# Patient Record
Sex: Female | Born: 1975 | Race: White | Hispanic: Yes | Marital: Single | Smoking: Never smoker
Health system: Southern US, Community
[De-identification: ages and names within clinical notes are randomized; demographics above are authoritative.]

---

## 2003-05-29 ENCOUNTER — Other Ambulatory Visit: Admission: RE | Admit: 2003-05-29 | Discharge: 2003-05-29 | Payer: Self-pay | Admitting: Obstetrics and Gynecology

## 2004-04-02 ENCOUNTER — Ambulatory Visit: Payer: Self-pay | Admitting: Internal Medicine

## 2004-05-08 ENCOUNTER — Ambulatory Visit: Payer: Self-pay | Admitting: *Deleted

## 2004-05-08 ENCOUNTER — Ambulatory Visit: Payer: Self-pay | Admitting: Internal Medicine

## 2004-05-11 ENCOUNTER — Emergency Department (HOSPITAL_COMMUNITY): Admission: EM | Admit: 2004-05-11 | Discharge: 2004-05-11 | Payer: Self-pay | Admitting: Emergency Medicine

## 2004-05-22 ENCOUNTER — Ambulatory Visit: Payer: Self-pay | Admitting: Internal Medicine

## 2004-06-04 ENCOUNTER — Ambulatory Visit: Payer: Self-pay | Admitting: Obstetrics and Gynecology

## 2004-07-01 ENCOUNTER — Ambulatory Visit (HOSPITAL_COMMUNITY): Admission: RE | Admit: 2004-07-01 | Discharge: 2004-07-01 | Payer: Self-pay | Admitting: Obstetrics and Gynecology

## 2004-08-08 ENCOUNTER — Ambulatory Visit: Payer: Self-pay | Admitting: Obstetrics and Gynecology

## 2007-04-07 IMAGING — US US TRANSVAGINAL NON-OB
1 series · 14 of 25 positions shown · non-contrast
Comparison: None.

CLINICAL DATA: Abdominal and pelvic pain.  History of appendectomy and umbilical hernia repair.   Negative pregnancy test.  Afebrile.  
 PELVIC ULTRASOUND COMPLETE (TRANSABDOMINAL AND ENDOVAGINAL STUDIES):

[Series 1: unknown · 0.32mm/px · 14 of 79 slices shown]
[im 1/79]
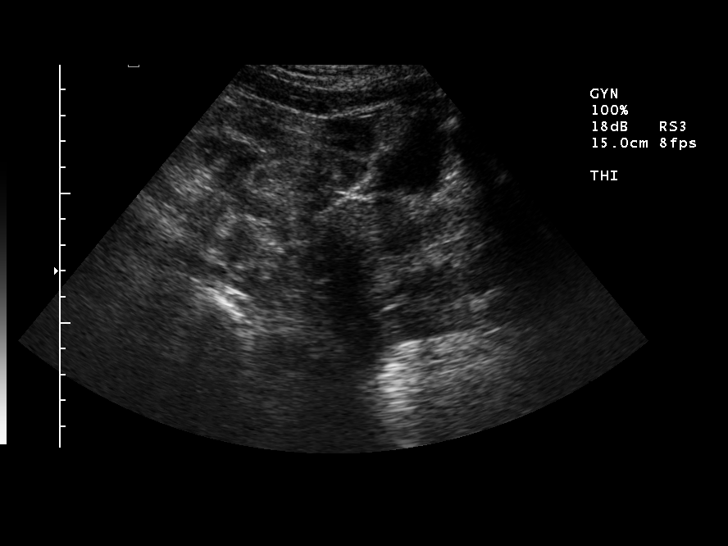
[im 7/79]
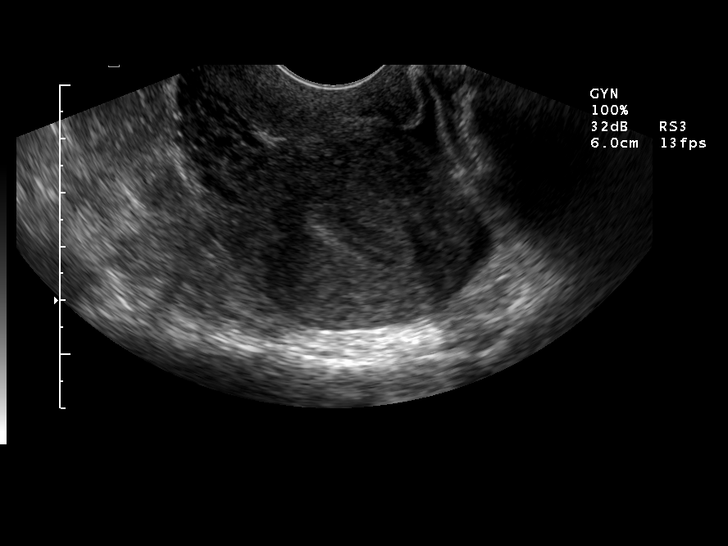
[im 14/79]
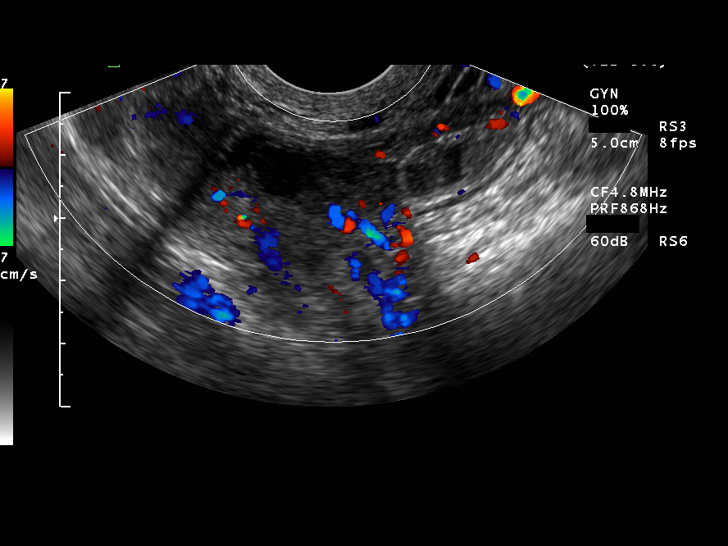
[im 20/79]
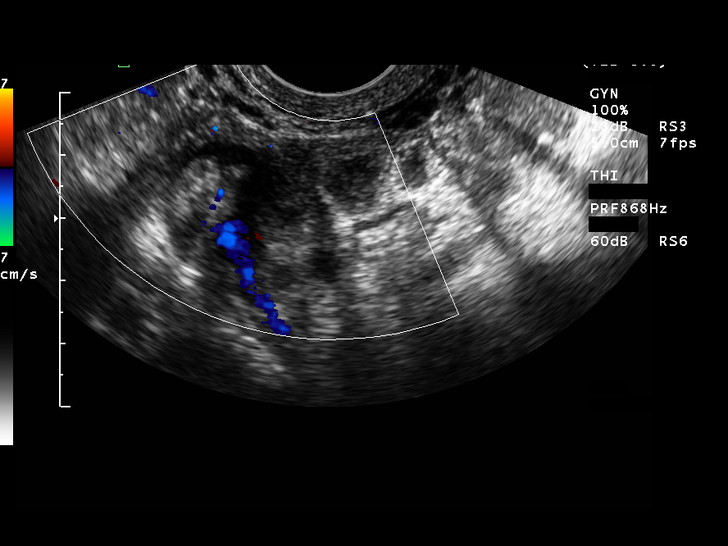
[im 27/79]
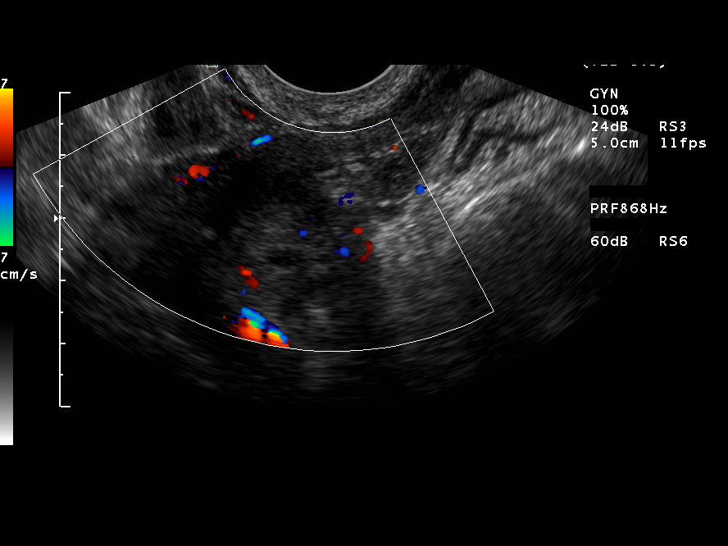
[im 30/79]
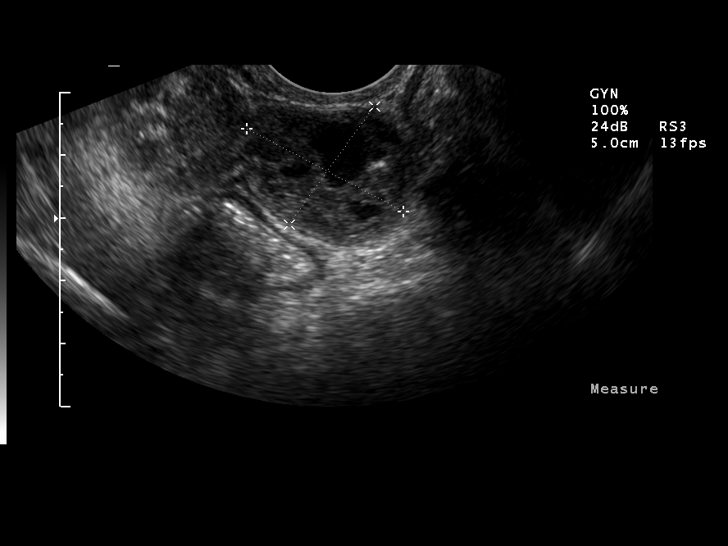
[im 36/79]
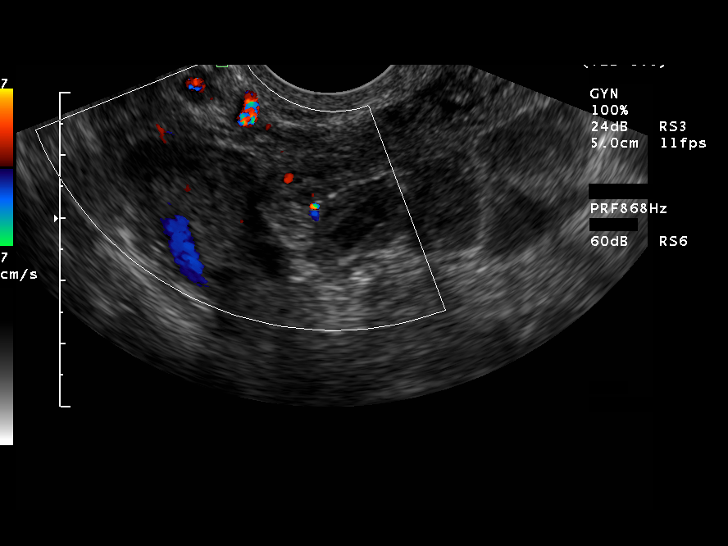
[im 43/79]
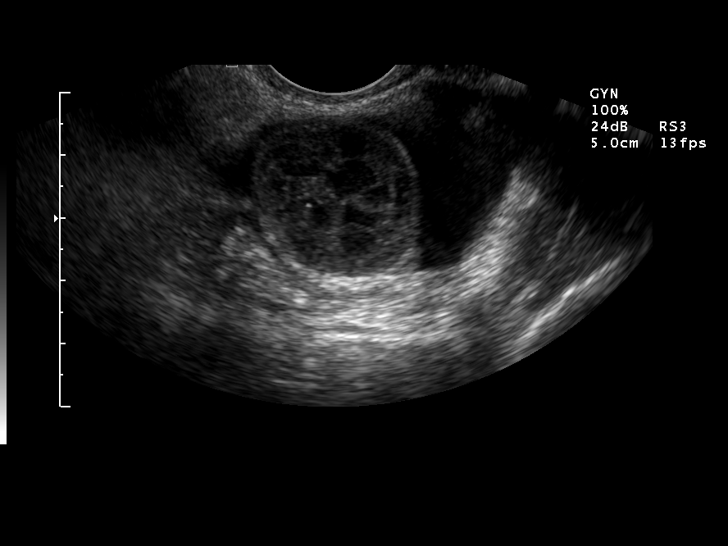
[im 49/79]
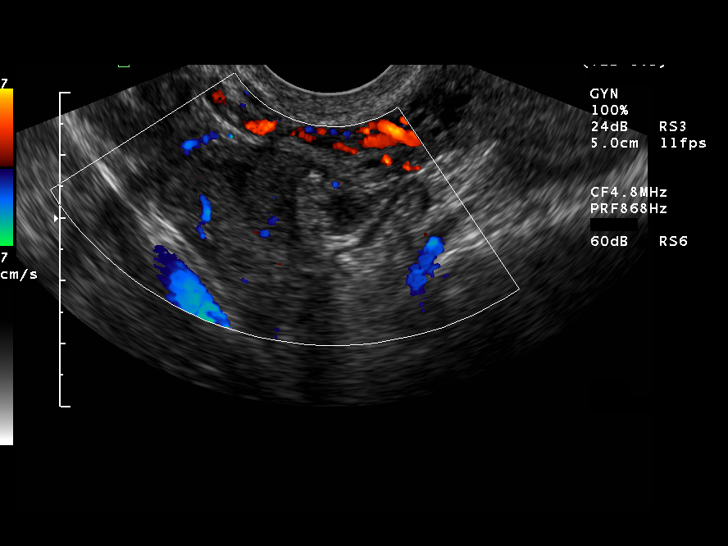
[im 53/79]
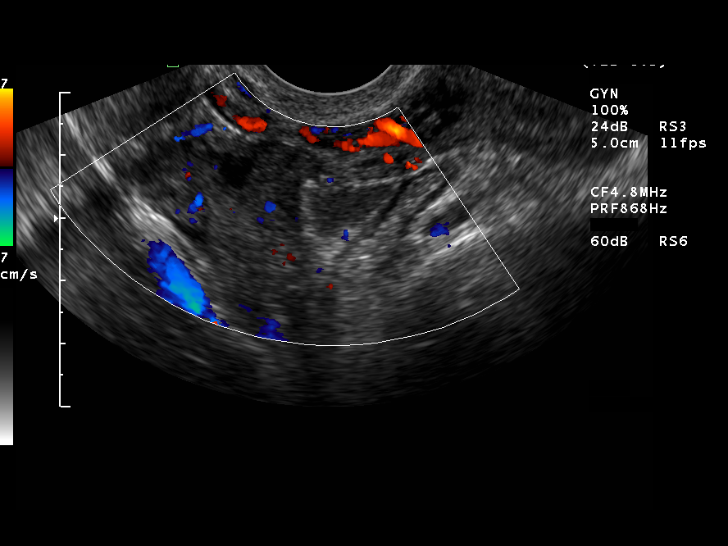
[im 59/79]
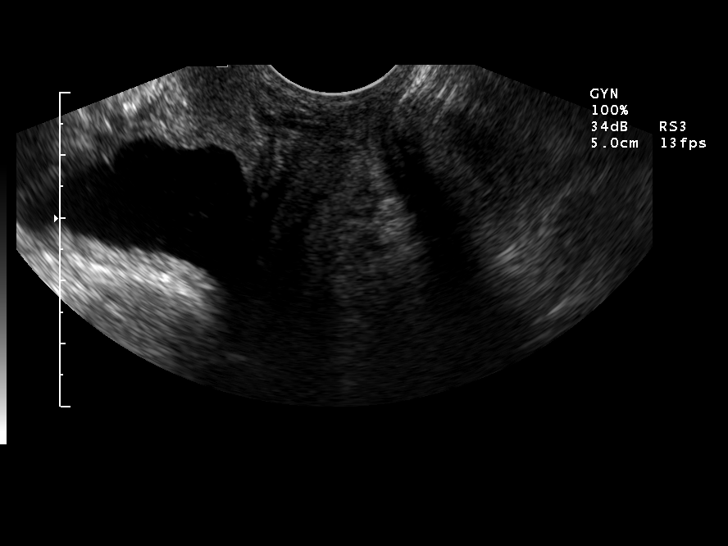
[im 66/79]
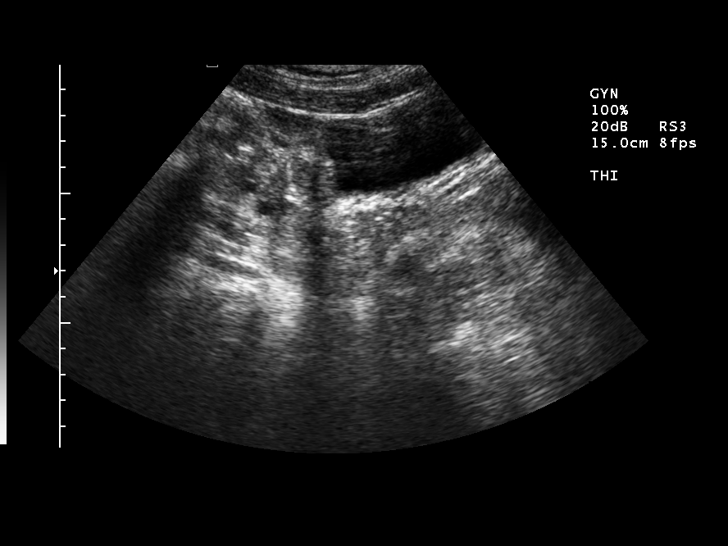
[im 72/79]
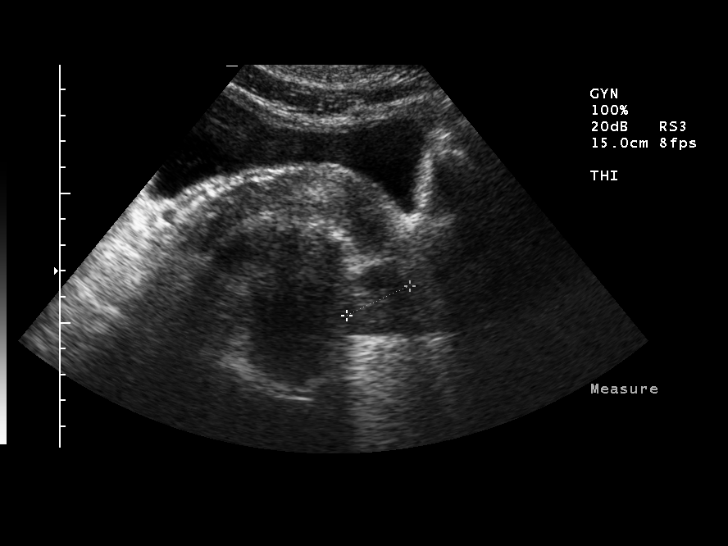
[im 79/79]
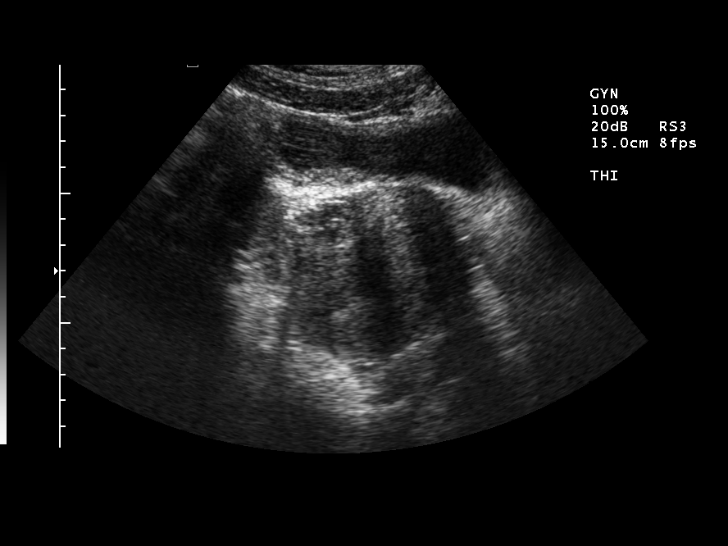

[14 of 25 positions shown; findings below may reference images not displayed]

FINDINGS: The uterus is retroverted, measuring approximately 6.2 X 4.2 X 4.6 cm.  Endometrium measures 1.3 cm in thickness.  No myometrial abnormalities are demonstrated.  
 There is a moderate amount of free pelvic fluid, which is mildly echogenic.  The right ovary measures 3.0 X 1.9 X 2.1 cm and contains a probable collapsing cyst measuring 1.4 cm in greatest dimension.  The left ovary appears to be displaced into the pelvic cul-de-sac and measures 2.8 X 2.3 X 3.1 cm.  It contains multiple small follicles without focally suspicious lesion.
IMPRESSION: 1.  Moderate amount of free pelvic fluid with collapsing right ovarian cyst, suggesting recently ruptured ovarian cyst. 
 2.  Uterus and left ovary demonstrate no suspicious findings.

## 2009-05-09 ENCOUNTER — Ambulatory Visit (HOSPITAL_COMMUNITY): Admission: RE | Admit: 2009-05-09 | Discharge: 2009-05-09 | Payer: Self-pay | Admitting: Obstetrics and Gynecology

## 2009-06-13 ENCOUNTER — Ambulatory Visit (HOSPITAL_COMMUNITY): Admission: RE | Admit: 2009-06-13 | Discharge: 2009-06-13 | Payer: Self-pay | Admitting: Obstetrics and Gynecology

## 2010-02-03 ENCOUNTER — Encounter: Payer: Self-pay | Admitting: *Deleted

## 2010-02-03 ENCOUNTER — Encounter: Payer: Self-pay | Admitting: Obstetrics and Gynecology

## 2010-05-31 NOTE — Group Therapy Note (Signed)
NAME:  Melissa Brady, Melissa Brady NO.:  [id number]   MEDICAL RECORD NO.:  [id number]          PATIENT TYPE:  WOC   LOCATION:  [facility]                   FACILITY:  [facility]   PHYSICIAN:  Argentina Donovan, MD        DATE OF BIRTH:  23-Jan-1975   DATE OF SERVICE:                                    CLINIC NOTE   HISTORY OF PRESENT ILLNESS:  The patient is a 35 year old gravida 1, para 1-  0-0-1 who is seen by [facility], and over the past 2 months, has had some  lower abdominal pain, and was seen there, and had an ultrasound that showed  a moderate amount of free fluid and a collapsing right ovarian cyst.  We are  going to need a followup on that ultrasound in 3 weeks which would be 6  weeks from the time of the previous one.  We have discussed ovarian  suppression with birth control pills.  She is not anxious to take medication  at this time.  In addition to this, she is complaining of epigastric pain.  She has a stressful job.  She has a history of umbilical hernia repair in  [place] and ascites, and we will do an H. pylori test, and treat if  necessary.  I will call the patient with the results of that test when it  does come back.   IMPRESSION:  1.  Epigastric pain.  2.  Pelvic pain of a month-and-a-half duration secondary to a ruptured      ovarian cyst.      PR/MEDQ  D:  06/04/2004  T:  06/04/2004  Job:  [id number]

## 2012-05-09 IMAGING — US US OB DETAIL+14 WK
1 series · 14 of 28 positions shown · non-contrast
Comparison: none

OBSTETRICAL ULTRASOUND:
 This ultrasound was performed in The [HOSPITAL], and the [facility] report will be stored to [REDACTED] PACS.  This report is also available in [HOSPITAL]?s accessANYware.

[Series 1: us ob detail+14 wk · 14 of 96 slices shown]
[im 4/96]
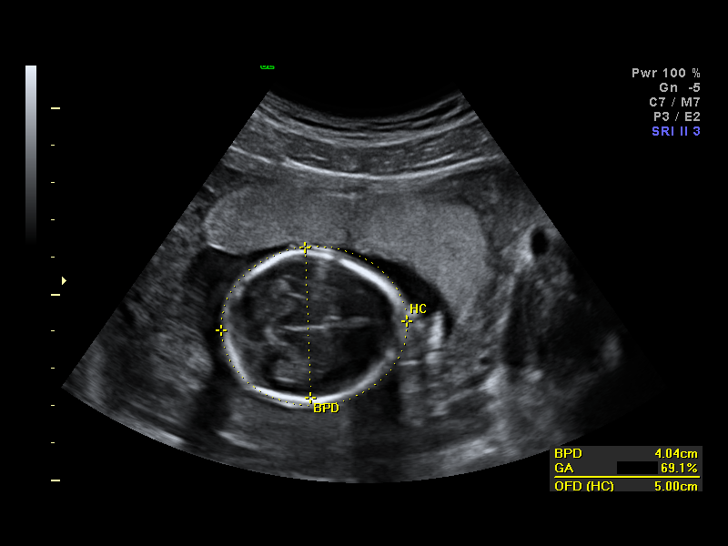
[im 11/96]
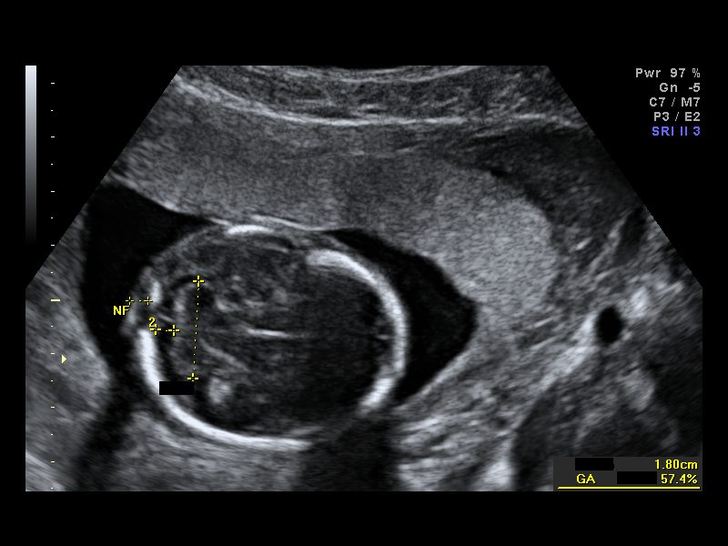
[im 18/96]
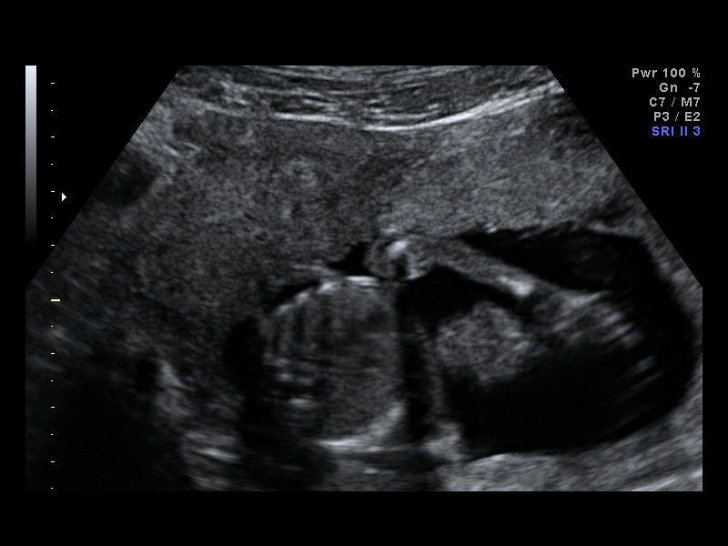
[im 25/96]
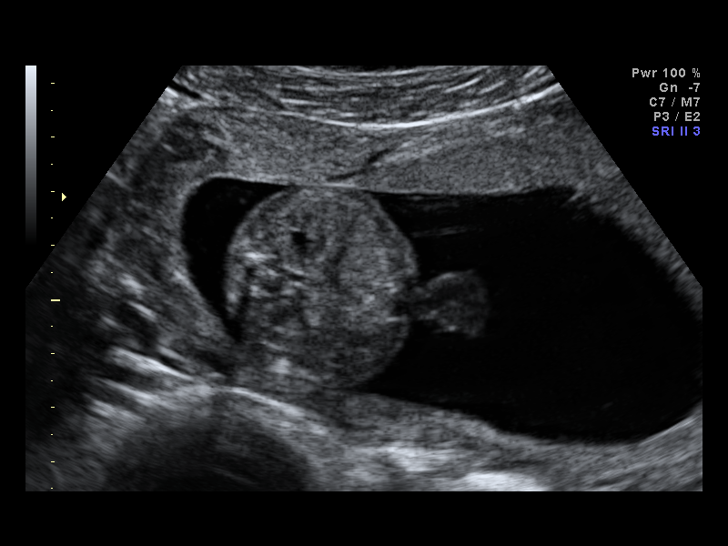
[im 32/96]
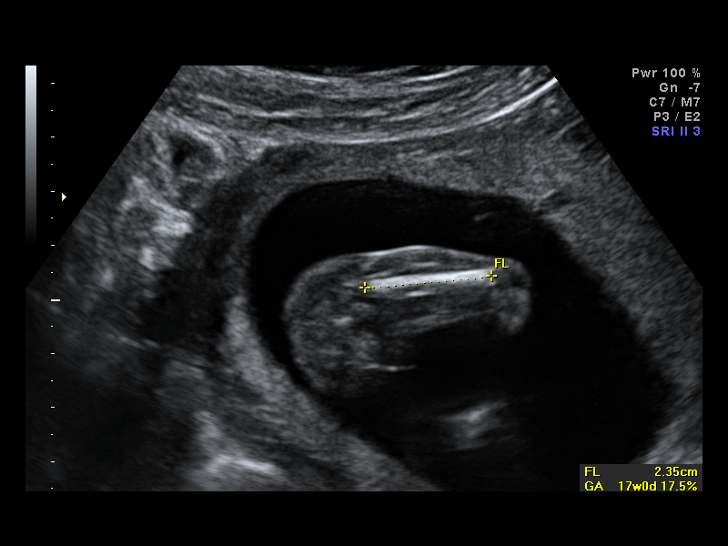
[im 39/96]
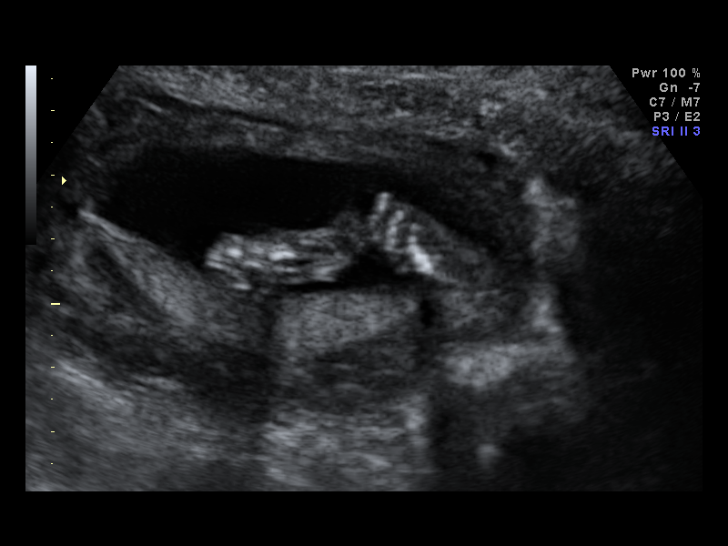
[im 46/96]
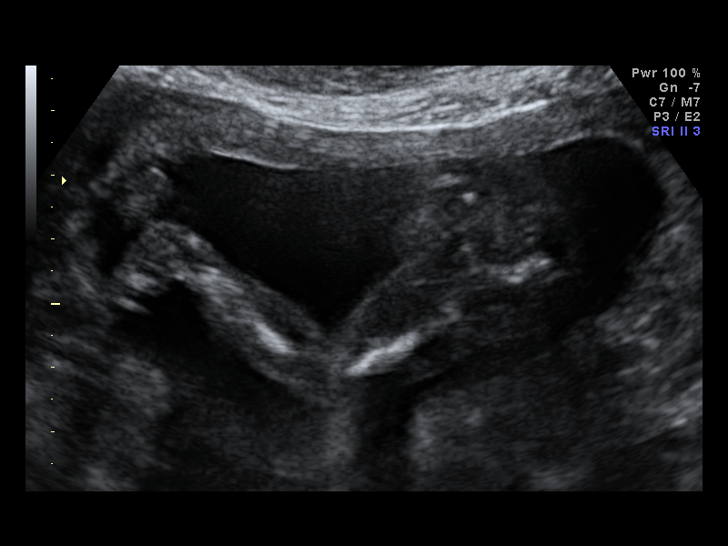
[im 53/96]
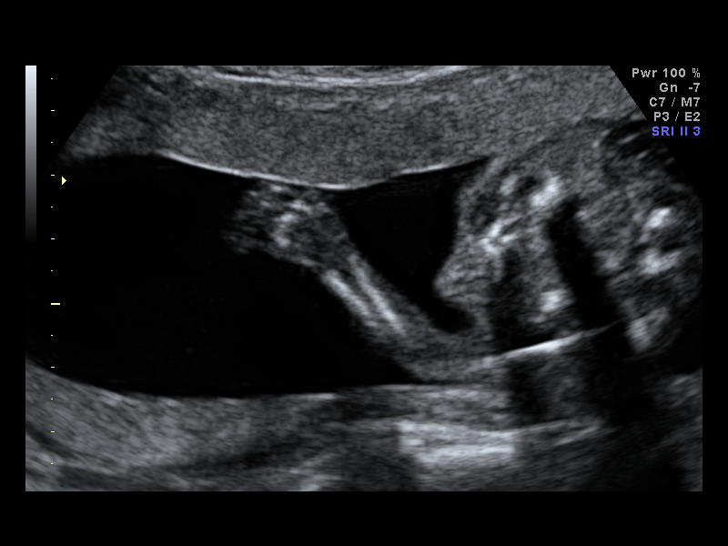
[im 60/96]
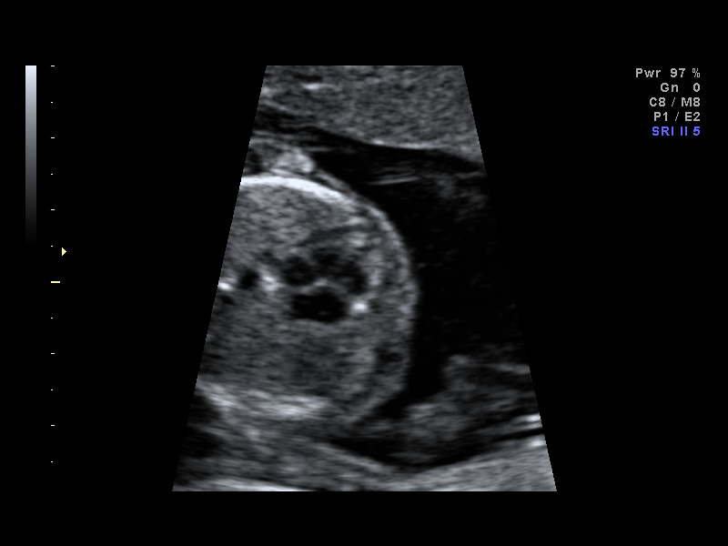
[im 67/96]
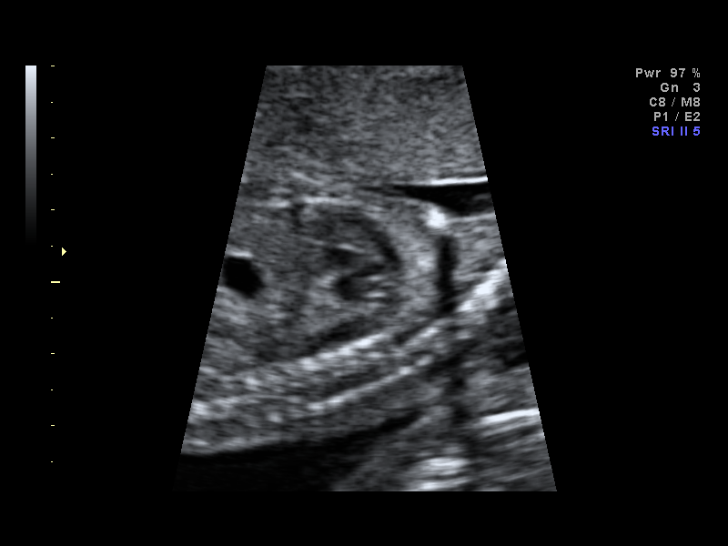
[im 74/96]
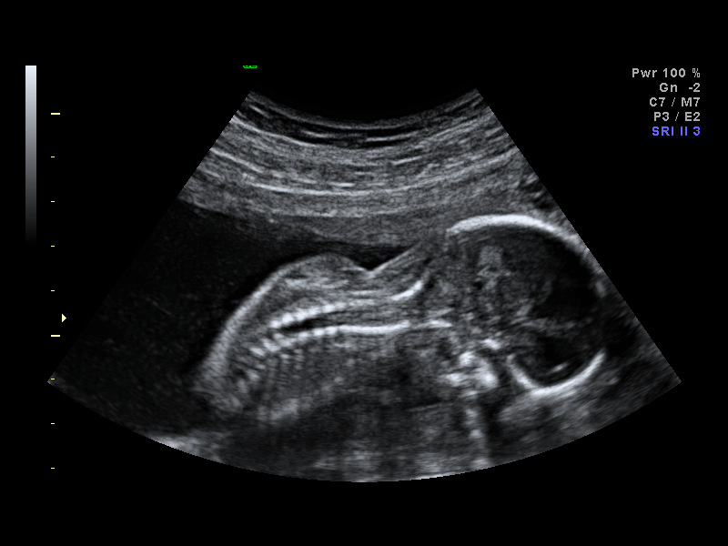
[im 81/96]
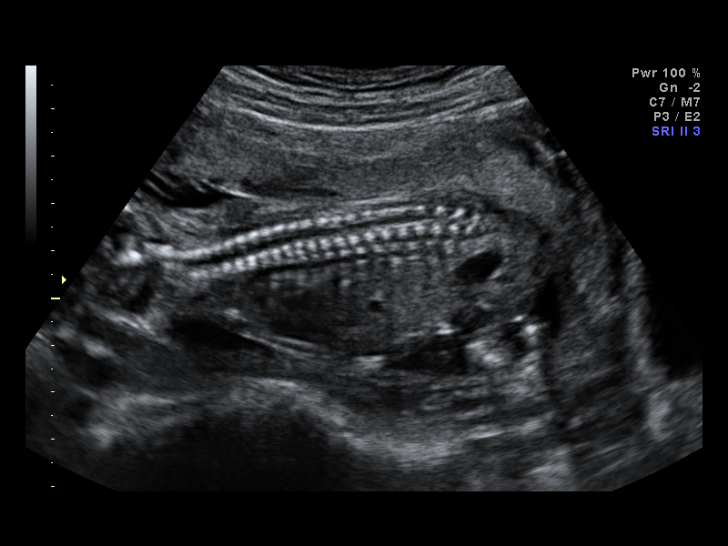
[im 88/96]
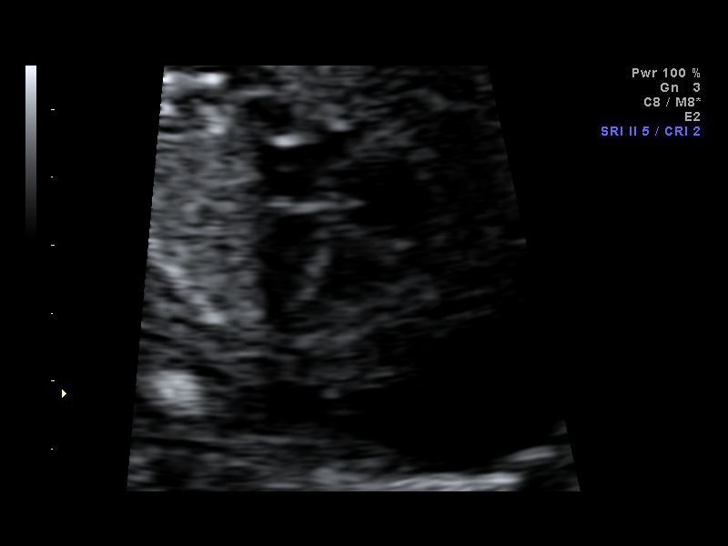
[im 96/96]
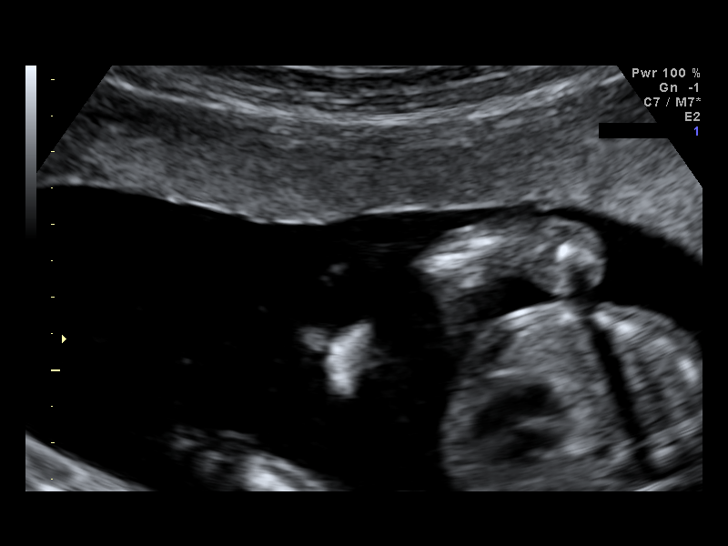

[14 of 28 positions shown; findings below may reference images not displayed]

IMPRESSION: [facility] has also been faxed to the ordering physician.

## 2015-09-03 ENCOUNTER — Encounter: Payer: Self-pay | Admitting: Obstetrics & Gynecology

## 2015-10-15 ENCOUNTER — Ambulatory Visit (INDEPENDENT_AMBULATORY_CARE_PROVIDER_SITE_OTHER): Payer: No Typology Code available for payment source | Admitting: Obstetrics & Gynecology

## 2015-10-15 ENCOUNTER — Encounter: Payer: Self-pay | Admitting: Obstetrics & Gynecology

## 2015-10-15 VITALS — BP 109/62 | HR 83 | Wt 167.0 lb

## 2015-10-15 DIAGNOSIS — N889 Noninflammatory disorder of cervix uteri, unspecified: Secondary | ICD-10-CM

## 2015-10-15 DIAGNOSIS — N9089 Other specified noninflammatory disorders of vulva and perineum: Secondary | ICD-10-CM | POA: Insufficient documentation

## 2015-10-15 DIAGNOSIS — Z1151 Encounter for screening for human papillomavirus (HPV): Secondary | ICD-10-CM

## 2015-10-15 DIAGNOSIS — R87619 Unspecified abnormal cytological findings in specimens from cervix uteri: Secondary | ICD-10-CM | POA: Insufficient documentation

## 2015-10-15 DIAGNOSIS — Z01419 Encounter for gynecological examination (general) (routine) without abnormal findings: Secondary | ICD-10-CM | POA: Diagnosis not present

## 2015-10-15 DIAGNOSIS — Z124 Encounter for screening for malignant neoplasm of cervix: Secondary | ICD-10-CM

## 2015-10-15 MED ORDER — CLOBETASOL PROPIONATE 0.05 % EX OINT: TOPICAL_OINTMENT | CUTANEOUS | 5 refills | Status: DC

## 2015-10-15 MED ORDER — CLOBETASOL PROPIONATE 0.05 % EX OINT: TOPICAL_OINTMENT | CUTANEOUS | 5 refills | Status: AC

## 2015-10-15 NOTE — Progress Notes (Addendum)
GYNECOLOGY VISIT NOTE  History:  40 y.o. G2P0 here today for evaluation of bumps on her vagina that she had noticed since January 2017. Bumps are mobile, nontender unless she applies pressure on them.  Also noticed some itching on the bottom part of her labia; happens after she wears tight jeans or other constricting undergarments.  Also needs pap smear; last one was over 5 years ago.  She also reports upper andominal pain, especially after drinking milk or eating certain foods. She denies any abnormal vaginal discharge, bleeding, pelvic pain or other concerns.   No past medical history on file.  No past surgical history on file.  The following portions of the patient's history were reviewed and updated as appropriate: allergies, current medications, past family history, past medical history, past social history, past surgical history and problem list.   Health Maintenance:  Normal pap several years ago.  Normal mammogram in 2015.   Review of Systems:  Pertinent items noted in HPI and remainder of comprehensive ROS otherwise negative.  Objective:  Physical Exam BP 109/62   Pulse 83   Wt 167 lb (75.8 kg)   LMP 10/11/2015 (Exact Date)  CONSTITUTIONAL: Well-developed, well-nourished female in no acute distress.  HENT:  Normocephalic, atraumatic. External right and left ear normal. Oropharynx is clear and moist EYES: Conjunctivae and EOM are normal. Pupils are equal, round, and reactive to light. No scleral icterus.  NECK: Normal range of motion, supple, no masses SKIN: Skin is warm and dry. No rash noted. Not diaphoretic. No erythema. No pallor. NEUROLOGIC: Alert and oriented to person, place, and time. Normal reflexes, muscle tone coordination. No cranial nerve deficit noted. PSYCHIATRIC: Normal mood and affect. Normal behavior. Normal judgment and thought content. CARDIOVASCULAR: Normal heart rate noted RESPIRATORY: Effort and breath sounds normal, no problems with respiration  noted ABDOMEN: Soft, no distention noted, mild tenderness to palpation in upper quadrants, ?Murphy's sign.  No lower abdominal pain or masses.  MUSCULOSKELETAL: Normal range of motion. No edema noted.  PELVIC: See below for vulvar and cervical findings.  Normal vagina.  Normal uterine size, no other palpable masses, no uterine or adnexal tenderness. Physical Exam  Genitourinary:          Assessment & Plan:  1. Encounter for gynecological examination with Papanicolaou smear of cervix 2. Abnormal appearance of cervix Cervical consistency on exam and appearance is very concerning. Will follow up results and manage accordingly.  Will also follow up ultrasound report.  Very concerning overall, given that she has not had pap smear in several years.  - Cytology - PAP - US Transvaginal Non-OB; Future - US Pelvis Complete; Future  3. Lesions of vulva Subcutaneous masses almost resemble enlarged lymph nodes but are not in expected location. Given cervical findings, concerned about these vulvar subcutaneous masses as being connected to a primary process in the cervix. Also concerned about possibility of vulvar granular cell tumors or other benign growths (such as fibromas) that can occur in this location.  Will follow up ultrasound report; may also need biopsy of these masses for further evaluation. Clobetasol prescribed for vulvar itching, no abnormal discharge noted. No findings concerning for candidiasis, seems to be contact dematitis for now. Advised to avoid tight/constricting garments.  - US Misc Soft Tissue; Future - clobetasol ointment (TEMOVATE) 0.05 %; Apply to affected area tid as needed  Dispense: 30 g; Refill: 5  Routine preventative health maintenance measures emphasized.  Advised to follow up with PCP for evaluation of upper  abdominal pain.  Please refer to After Visit Summary for other counseling recommendations.    Total face-to-face time with patient: 30 minutes. Over 50% of  encounter was spent on counseling and coordination of care.   Jaynie Collins, MD, FACOG Attending Obstetrician & Gynecologist, [facility] for [employer], [facility]

## 2015-10-16 LAB — CYTOLOGY - PAP

## 2015-10-16 NOTE — Addendum Note (Signed)
Addended by: Jaynie CollinsANYANWU, Trany Chernick A on: 10/16/2015 12:25 PM   Modules accepted: Orders

## 2015-10-18 ENCOUNTER — Encounter: Payer: Self-pay | Admitting: Obstetrics & Gynecology

## 2015-10-19 ENCOUNTER — Other Ambulatory Visit: Payer: Self-pay | Admitting: Obstetrics & Gynecology

## 2015-10-19 ENCOUNTER — Ambulatory Visit (HOSPITAL_COMMUNITY)
Admission: RE | Admit: 2015-10-19 | Discharge: 2015-10-19 | Disposition: A | Discharge status: Home / Self Care | Payer: No Typology Code available for payment source | Source: Ambulatory Visit | Attending: Obstetrics & Gynecology | Admitting: Obstetrics & Gynecology

## 2015-10-19 DIAGNOSIS — Z01419 Encounter for gynecological examination (general) (routine) without abnormal findings: Secondary | ICD-10-CM

## 2015-10-19 DIAGNOSIS — R87619 Unspecified abnormal cytological findings in specimens from cervix uteri: Secondary | ICD-10-CM

## 2015-10-19 DIAGNOSIS — N909 Noninflammatory disorder of vulva and perineum, unspecified: Secondary | ICD-10-CM | POA: Insufficient documentation

## 2015-10-19 DIAGNOSIS — N9089 Other specified noninflammatory disorders of vulva and perineum: Secondary | ICD-10-CM

## 2015-10-19 DIAGNOSIS — N83201 Unspecified ovarian cyst, right side: Secondary | ICD-10-CM | POA: Insufficient documentation

## 2015-10-24 ENCOUNTER — Telehealth: Payer: Self-pay | Admitting: *Deleted

## 2015-10-24 NOTE — Telephone Encounter (Signed)
Called pt w/[employer] # [phone] Melissa Brady(David) and was not able to leave a message due to voice mail being full.  Per Dr. Macon LargeAnyanwu, pt needs to be informed of abnormal Pap (AGUS) requiring follow up visit for Colposcopy and endometrial biopsy. Also pt needs to be informed that her pelvic US was normal however, the vulvar masses are concerning. She will have vulvar biopsy at the same time as Colpo. Message has been sent to scheduling staff to schedule the appt.

## 2015-10-30 NOTE — Telephone Encounter (Signed)
Called pt and informed pt of abnormal Pap Smear and the need for colposcopy and also endo bx.  I verified with pt that she had insurance pt stated "yes".  I informed pt appt scheduled for 11/22/15 @ 1430.  Pt stated thank you with no further questions.

## 2015-11-02 NOTE — Telephone Encounter (Addendum)
Called pt to notify her that her appt has been changed to Wednesday 11/8 @ 0740. She did not answer and the voice mailbox is full - unable to leave message.  Pt needs to be notified of appt change. [facility] to [facility] for her to try to reach pt also.   10/23  0825  Called pt w/Marly Pernell DupreAdams - interpreter and informed her of change in appt date and time.  Pt agreed and voiced understanding.

## 2015-11-21 ENCOUNTER — Other Ambulatory Visit (HOSPITAL_COMMUNITY)
Admission: RE | Admit: 2015-11-21 | Discharge: 2015-11-21 | Disposition: A | Discharge status: Home / Self Care | Payer: No Typology Code available for payment source | Source: Ambulatory Visit | Attending: Obstetrics & Gynecology | Admitting: Obstetrics & Gynecology

## 2015-11-21 ENCOUNTER — Encounter: Payer: Self-pay | Admitting: Obstetrics & Gynecology

## 2015-11-21 ENCOUNTER — Ambulatory Visit: Payer: No Typology Code available for payment source | Admitting: Clinical

## 2015-11-21 ENCOUNTER — Ambulatory Visit (INDEPENDENT_AMBULATORY_CARE_PROVIDER_SITE_OTHER): Payer: No Typology Code available for payment source | Admitting: Obstetrics & Gynecology

## 2015-11-21 VITALS — BP 100/61 | HR 70 | Ht 67.0 in | Wt 169.0 lb

## 2015-11-21 DIAGNOSIS — Z3202 Encounter for pregnancy test, result negative: Secondary | ICD-10-CM

## 2015-11-21 DIAGNOSIS — R87619 Unspecified abnormal cytological findings in specimens from cervix uteri: Secondary | ICD-10-CM | POA: Insufficient documentation

## 2015-11-21 DIAGNOSIS — N9089 Other specified noninflammatory disorders of vulva and perineum: Secondary | ICD-10-CM

## 2015-11-21 DIAGNOSIS — F418 Other specified anxiety disorders: Secondary | ICD-10-CM

## 2015-11-21 LAB — POCT PREGNANCY, URINE: Preg Test, Ur: NEGATIVE

## 2015-11-21 NOTE — BH Specialist Note (Signed)
Session Start time: 8:20  End Time: 8:32 Total Time:  12 minutes Type of Service: Behavioral Health - Individual/Family Interpreter: No.   Interpreter Name & Language: n/a # [facility]   SUBJECTIVE: Melissa Brady is a 40 y.o. female  Pt. was referred by Jaynie CollinsUgonna Anyanwu, MD for:  anxiety. Pt. reports the following symptoms/concerns: Pt states that the level of anxiety that she feels is not a concern to her, she feels it is manageable and attributes recent increase to health concerns; she does not have any particular strategy for coping. Duration of problem:  Undetermined number of years; does not consider a problem Severity: mild Previous treatment: none   OBJECTIVE: Mood: Appropriate & Affect: Appropriate Risk of harm to self or others: No known risk of harm to self or others Assessments administered: PHQ9: 2/ GAD7: 11   GOALS ADDRESSED:  -Alleviate symptoms of anxiety  INTERVENTIONS: Motivational Interviewing and Supportive   ASSESSMENT:  Pt currently experiencing Anxiety about health, mild.  Pt may benefit from psychoeducation and brief therapeutic intervention regarding coping with symptoms of anxiety.   PLAN: 1. F/U with behavioral health clinician: As needed 2. Behavioral Health meds: none 3. Behavioral recommendations:  -Consider reading educational material regarding coping with symptoms of anxiety -Consider trying apps for additional self-care/symptoms of anxiety 4. Referral: Brief Counseling/Psychotherapy and Psychoeducation   Woc-Behavioral Health Clinician  Behavioral Health Clinician  Marlon PelWarmhandoff:   Warm Hand Off Completed.         Depression screen PHQ 2/9 11/21/2015  Decreased Interest 0  Down, Depressed, Hopeless 0  PHQ - 2 Score 0  Altered sleeping 0  Tired, decreased energy 1  Change in appetite 0  Feeling bad or failure about yourself  1  Trouble concentrating 0  Moving slowly or fidgety/restless 0  Suicidal  thoughts 0  PHQ-9 Score 2    GAD 7 : Generalized Anxiety Score 11/21/2015  Nervous, Anxious, on Edge 3  Control/stop worrying 1  Worry too much - different things 1  Trouble relaxing 1  Restless 1  Easily annoyed or irritable 3  Afraid - awful might happen 1  Total GAD 7 Score 11

## 2015-11-21 NOTE — Patient Instructions (Signed)
Colposcopa - Cuidados posteriores (Colposcopy, Care After) Siga estas instrucciones durante las prximas semanas. Estas indicaciones le proporcionan informacin general acerca de cmo deber cuidarse despus del procedimiento. El mdico tambin podr darle instrucciones ms especficas. El tratamiento se ha planificado de acuerdo a las prcticas mdicas actuales, pero a veces se producen problemas. Comunquese con el mdico si tiene algn problema o tiene dudas despus del procedimiento. QU ESPERAR DESPUS DEL PROCEDIMIENTO  Despus del procedimiento, es tpico tener las siguientes sensaciones:  Clicos. Generalmente se calman en algunos minutos.  Dolor. Puede durar [facility] Lincolnhasta dos das.  Aturdimiento. Si esto le ocurre, recustese durante algunos minutos. Podr tener un sangrado leve o una secrecin oscura que debe detenerse en Monson Centeralgunos das. Durante este tiempo deber usar un apsito sanitario. INSTRUCCIONES PARA EL CUIDADO EN EL HOGAR  Evite las [address] Nerelaciones sexuales, las duchas vaginales y el uso de tampones durante [address]  [address]das, o segn lo que le indique su mdico.  Tome slo medicamentos de venta libre o recetados, segn las indicaciones del mdico. No tome aspirina, ya que puede causar hemorragias.  Si utiliza pldoras anticonceptivas, contine tomndolas.  No todos los resultados estarn disponibles durante su visita. En este caso, tenga otra entrevista con su mdico para conocerlos. No suponga que es normal si no tiene noticias de su mdico o del establecimiento de salud. Es Copyimportante el seguimiento de todos los [place]resultados de Crab Orchardlos estudios.  Siga los consejos de su mdico con respecto a los Comanchemedicamentos, Stonegaactividades, visitas y [place] de control. SOLICITE ATENCIN MDICA SI:  Aparece una erupcin cutnea.  Tiene problemas con los medicamentos. SOLICITE ATENCIN MDICA DE INMEDIATO SI:  Tiene una hemorragia abundante o elimina cogulos.  Tiene fiebre.  Tiene flujo vaginal  anormal.  Tiene clicos que no se alivian luego de tomar analgsicos.  Se siente mareada, tiene vahdos o se desmaya.  Siente Physiological scientistdolor en el estmago.   Esta informacin no tiene Theme park managercomo fin reemplazar el consejo del mdico. Asegrese de hacerle al mdico cualquier pregunta que tenga.   Biopsia de endometrio - Pharmacist, communityCuidados posteriores (Endometrial Biopsy, Care After) Siga estas instrucciones durante las prximas semanas. Estas indicaciones le proporcionan informacin general acerca de cmo deber cuidarse despus del procedimiento. El mdico tambin podr darle instrucciones ms especficas. El tratamiento se ha planificado de acuerdo a las prcticas mdicas actuales, pero a veces se producen problemas. Comunquese con el mdico si tiene algn problema o tiene dudas despus del procedimiento. QU ESPERAR DESPUS DEL PROCEDIMIENTO Despus del procedimiento, es tpico tener las siguientes sensaciones:  Sentir clicos leves y tendr una pequea cantidad de sangrado vaginal durante algunos das despus del procedimiento. Esto es normal. INSTRUCCIONES PARA EL CUIDADO EN EL HOGAR  Tome slo medicamentos de venta libre o recetados, segn las indicaciones del mdico.  No utilice tampones, duchas vaginales ni tenga relaciones sexuales hasta que el profesional la autorice.  Siga las indicaciones del mdico relacionadas con la restriccin a ciertas actividades, como ejercicios fsicos intensos o levantar objetos pesados. SOLICITE ATENCIN MDICA SI:  Tiene un sangrado abundante o sangra durante ms de 2 das despus del procedimiento.  Advierte un olor ftido que proviene de la vagina.  Siente escalofros o tiene fiebre.  Siente un dolor en el bajo vientre (abdominal) muy intenso. SOLICITE ATENCIN MDICA DE INMEDIATO SI:  Siente clicos intensos en el estmago o en la espalda.  Elimina cogulos grandes.  La hemorragia aumenta.  Se siente mareada, dbil, o se desmaya.   Esta informacin no  tiene Building services engineercomo fin reemplazar el  consejo del mdico. Asegrese de hacerle al mdico cualquier pregunta que tenga.   Document Released: 10/20/2012 Elsevier Interactive Patient Education 2016 Elsevier Inc.   Biopsia de vulva - Cuidados posteriores  (Vulva Biopsy, Care After) Siga estas instrucciones durante las prximas semanas. Estas indicaciones le proporcionan informacin general acerca de cmo deber cuidarse despus del procedimiento. El mdico tambin podr darle instrucciones ms especficas. El tratamiento ha sido planificado segn las prcticas mdicas actuales, pero en algunos casos pueden ocurrir complicaciones. Comunquese con el mdico si tiene algn problema o tiene preguntas despus del procedimiento.  INSTRUCCIONES PARA EL CUIDADO EN EL HOGAR   Slo tome medicamentos de venta libre o recetados para Primary school teachercalmar el dolor o Environmental health practitionerel malestar, segn las indicaciones de su mdico.  No frote la zona de la biopsia despus de Geographical information systems officerorinar. D golpecitos suaves para secar o use una botella llena con agua tibia (botella perineal) para limpiar la zona. Lmpiese suavemente desde adelante hacia atrs.  Higienice la zona con agua y un Palestinian Territoryjabn suave. Seque la zona con golpecitos suaves.  Revise el sitio de la biopsia con un espejo de Valero Energymano todos los das para asegurarse de que est curando correctamente.  No tenga relaciones sexuales durante 7 das o segn las indicaciones de su mdico.  Evite hacer ejercicios como correr o Lobbyistandar en bicicleta, durante 2 o 3 das o segn las indicaciones de su mdico.  Despus del procedimiento podr ducharse. Evite baarse y nadar General Millshasta que las suturas se disuelvan y la curacin sea Hoopercompleta.  Use ropa interior suelta de algodn. No use pantalones ajustados.  Concurra puntualmente a las citas de control con el mdico.  Comunquese con su mdico para retirar el resultado de los estudios. SOLICITE ATENCIN MDICA DE INMEDIATO SI:   El dolor es ms intenso y los medicamentos no  Contractorhacen efecto.  La zona vaginal est hinchada o irritada.  Drena lquido o siente mal olor en la vagina.  Tiene fiebre. ASEGRESE DE QUE:   Comprende estas instrucciones.  Controlar su enfermedad.  Solicitar ayuda de inmediato si no mejora o si empeora.   Esta informacin no tiene Theme park managercomo fin reemplazar el consejo del mdico. Asegrese de hacerle al mdico cualquier pregunta que tenga.   Document Released: 12/17/2011 Elsevier Interactive Patient Education [employer]2016 Elsevier Inc.  Document Released: 10/20/2012 Elsevier Interactive Patient Education [employer]2016 Elsevier Inc.

## 2015-11-21 NOTE — Progress Notes (Signed)
    40 y.o. G2P0 here today for evaluation of recent abnormal pap smear and vulva lesions.  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  Patient also had some concerning vulvar lesions that have been persistent since January 2017.  Patient is here today for colposcopy, biopsies, ECC, endometrial biopsy for the AGC pap smear and vulvar biopsy for the vulvar lesions.  COLPOSCOPY AND ENDOMETRIAL BIOPSY PROCEDURE NOTE Patient here for colposcopy and endometrial biopsy  for Atypical Glandular Cells pap smear on 10/15/2015. She was given informed consent, signed copy in the chart, time out was performed.  Urine HCG was negative. Placed in lithotomy position. Cervix viewed with speculum and colposcope after application of acetic acid.  Colposcopy adequate? Yes No visible lesions but was very friable around 6 o'clock; corresponding biopsy obtained.  ECC specimen obtained. For the endometrial biopsy, the cervix was prepped with Betadine. The 3 mm pipelle was introduced into the endometrial cavity without difficulty to a depth of 7 cm, and a moderate amount of tissue was obtained and sent to pathology. The instruments were removed from the patient's vagina. Minimal bleeding from the cervix was noted.   VULVAR BIOPSY NOTE The indications for vulvar biopsy (rule out neoplasia or other abnormalities) were reviewed.   Risks of the biopsy including pain, bleeding, infection, inadequate specimen, scarring and need for additional procedures were discussed. The patient stated understanding and agreed to undergo procedure today. Consent was signed, time out performed.  The patient's vulva was prepped with Betadine. 1% lidocaine was injected into the area above the subcutaneous mass in the R labium.  A 3-mm punch biopsy was done, there was some sebaceous material extruded from the lesion,  biopsy tissue and the sebaceous material was picked up with sterile forceps and  sterile scissors were used to excise the lesion.  Small bleeding was noted and hemostasis was achieved using silver nitrate sticks and 4-0 Vicryl figure of eight stitch.  The patient tolerated the procedure well. Post-procedure instructions  (pelvic rest for one week) were given to the patient. The patient is to call with heavy bleeding, fever greater than 100.4, foul smelling vaginal discharge or other concerns.   All specimens were labeled and sent to pathology.  Patient tolerated all procedures well and will return in 2 weeks to discuss all results and further management.    Jaynie CollinsUGONNA  ANYANWU, MD, FACOG Attending Obstetrician & Gynecologist, [facility] for [facility], [facility]

## 2015-11-22 ENCOUNTER — Encounter: Payer: No Typology Code available for payment source | Admitting: Obstetrics and Gynecology

## 2015-11-23 ENCOUNTER — Telehealth: Payer: Self-pay | Admitting: *Deleted

## 2015-11-23 NOTE — Progress Notes (Signed)
Patient had AGC pap smear, and vulvar subcutaneous masses. 1. Cervix, biopsy, 6 o'clock - FRAGMENTS OF BENIGN TRANSITIONAL ZONE MUCOSA.  THERE IS NO EVIDENCE OF MALIGNANCY. 2. Endocervix, curettage  - BENIGN ENDOCERVICAL MUCOSA. THERE IS NO EVIDENCE OF MALIGNANCY. 3. Endometrium, biopsy - BENIGN SECRETORY-TYPE ENDOMETRIUM.THERE IS NO EVIDENCE OF HYPERPLASIA OR MALIGNANCY. 4. Vulva, biopsy - BENIGN MILDLY INFLAMED SQUAMOUS LINED MUCOSA WITH ASSOCIATED ADIPOSE TISSUE. THERE IS NO EVIDENCE OF MALIGNANCY. A cyst lining is not histologically identified in specimen. However, there is mature lobulated adipose tissue and that may represent a subcutaneous lipoma.  Please inform patient of benign results for all. Will repeat pap in one year.  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  We will discuss these results again during her follow up visit on 12/14/15 but please call to reassure her that the results are benign.  Tereso NewcomerUgonna A Christopherjame Carnell, MD

## 2015-11-23 NOTE — Telephone Encounter (Signed)
Attempted to call patient but did not get answer. Will try again Monday.

## 2015-11-23 NOTE — Telephone Encounter (Signed)
-----   Message from Tereso NewcomerUgonna A Anyanwu, MD sent at 11/23/2015  8:47 AM EST ----- Patient had AGC pap smear, and vulvar subcutaneous masses. 1. Cervix, biopsy, 6 o'clock - FRAGMENTS OF BENIGN TRANSITIONAL ZONE MUCOSA.  THERE IS NO EVIDENCE OF MALIGNANCY. 2. Endocervix, curettage  - BENIGN ENDOCERVICAL MUCOSA. THERE IS NO EVIDENCE OF MALIGNANCY. 3. Endometrium, biopsy - BENIGN SECRETORY-TYPE ENDOMETRIUM.THERE IS NO EVIDENCE OF HYPERPLASIA OR MALIGNANCY. 4. Vulva, biopsy - BENIGN MILDLY INFLAMED SQUAMOUS LINED MUCOSA WITH ASSOCIATED ADIPOSE TISSUE. THERE IS NO EVIDENCE OF MALIGNANCY. A cyst lining is not histologically identified in specimen. However, there is mature lobulated adipose tissue and that may represent a subcutaneous lipoma.  Please inform patient of benign results for all. Will repeat pap in one year.  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  We will discuss these results again during her follow up visit on 12/14/15 but please call to reassure her that the results are benign.  Tereso NewcomerUgonna A Anyanwu, MD

## 2015-12-14 ENCOUNTER — Encounter: Payer: Self-pay | Admitting: Obstetrics & Gynecology

## 2015-12-14 ENCOUNTER — Ambulatory Visit (INDEPENDENT_AMBULATORY_CARE_PROVIDER_SITE_OTHER): Payer: No Typology Code available for payment source | Admitting: Obstetrics & Gynecology

## 2015-12-14 VITALS — BP 114/75 | HR 76 | Wt 170.0 lb

## 2015-12-14 DIAGNOSIS — R87619 Unspecified abnormal cytological findings in specimens from cervix uteri: Secondary | ICD-10-CM

## 2015-12-14 DIAGNOSIS — N9089 Other specified noninflammatory disorders of vulva and perineum: Secondary | ICD-10-CM

## 2015-12-14 NOTE — Progress Notes (Signed)
   GYNECOLOGY VISIT NOTE  History:  40 y.o. G2P0 here today for follow up results. She denies any abnormal vaginal discharge, bleeding, pelvic pain or other concerns.   No past medical history on file.  No past surgical history on file.  The following portions of the patient's history were reviewed and updated as appropriate: allergies, current medications, past family history, past medical history, past social history, past surgical history and problem list.    Review of Systems:  Pertinent items noted in HPI and remainder of comprehensive ROS otherwise negative.   Objective:  Physical Exam BP 114/75   Pulse 76   Wt 170 lb (77.1 kg)   LMP 12/13/2015   BMI 26.63 kg/m  CONSTITUTIONAL: Well-developed, well-nourished female in no acute distress.  HENT:  Normocephalic, atraumatic. External right and left ear normal. Oropharynx is clear and moist EYES: Conjunctivae and EOM are normal. Pupils are equal, round, and reactive to light. No scleral icterus.  NECK: Normal range of motion, supple, no masses SKIN: Skin is warm and dry. No rash noted. Not diaphoretic. No erythema. No pallor. NEUROLOGIC: Alert and oriented to person, place, and time. Normal reflexes, muscle tone coordination. No cranial nerve deficit noted. PSYCHIATRIC: Normal mood and affect. Normal behavior. Normal judgment and thought content. CARDIOVASCULAR: Normal heart rate noted RESPIRATORY: Effort and breath sounds normal, no problems with respiration noted ABDOMEN: Soft, no distention noted.   PELVIC: Deferred MUSCULOSKELETAL: Normal range of motion. No edema noted.  Labs and Imaging Patient had AGC pap smear, and vulvar subcutaneous masses. 1. Cervix, biopsy, 6 o'clock - FRAGMENTS OF BENIGN TRANSITIONAL ZONE MUCOSA.  THERE IS NO EVIDENCE OF MALIGNANCY. 2. Endocervix, curettage  - BENIGN ENDOCERVICAL MUCOSA. THERE IS NO EVIDENCE OF MALIGNANCY. 3. Endometrium, biopsy - BENIGN SECRETORY-TYPE ENDOMETRIUM.THERE IS NO  EVIDENCE OF HYPERPLASIA OR MALIGNANCY. 4. Vulva, biopsy - BENIGN MILDLY INFLAMED SQUAMOUS LINED MUCOSA WITH ASSOCIATED ADIPOSE TISSUE. THERE IS NO EVIDENCE OF MALIGNANCY. A cyst lining is not histologically identified in specimen. However, there is mature lobulated adipose tissue and that may represent a subcutaneous lipoma. All results discussed with patient  Assessment & Plan:  1. Atypical glandular cells on cervical pap smear done 10/15/15 Follow up in one year.   2. Lesion of vulva Benign lipoma, no further intervention needed Also discussed vulvar hygiene in detail with patient, discussed avoidance of perfumed soaps, detergents, lotions and any type of douches; in addition to wearing cotton underwear and no underwear at night.  Also recommended cleaning front to back, voiding and cleaning up after intercourse.   Routine preventative health maintenance measures emphasized. Please refer to After Visit Summary for other counseling recommendations.   Return in about 1 year (around 12/13/2016) for Pap smear.   Total face-to-face time with patient: 15 minutes. Over 50% of encounter was spent on counseling and coordination of care.   Melissa CollinsUGONNA  ANYANWU, MD, FACOG Attending Obstetrician & Gynecologist, [facility], [facility]

## 2015-12-14 NOTE — Patient Instructions (Signed)
Return to clinic for any scheduled appointments or for any gynecologic concerns as needed.   

## 2015-12-24 NOTE — Telephone Encounter (Signed)
Patient was seen on 12/14/2015 to discuss results.

## 2018-09-14 IMAGING — US US TRANSVAGINAL NON-OB
1 series · 15 of 25 positions shown · non-contrast
Comparison: No recent prior .

CLINICAL DATA: Abnormal Pap smear.

EXAM:
TRANSABDOMINAL AND TRANSVAGINAL ULTRASOUND OF PELVIS
TECHNIQUE: Both transabdominal and transvaginal ultrasound examinations of the
pelvis were performed. Transabdominal technique was performed for
global imaging of the pelvis including uterus, ovaries, adnexal
regions, and pelvic cul-de-sac. It was necessary to proceed with
endovaginal exam following the transabdominal exam to visualize the
uterus and ovaries.

[Series 1: us transvaginal non-ob · 101 acquisitions, 15 frames shown]
[im 1/101]
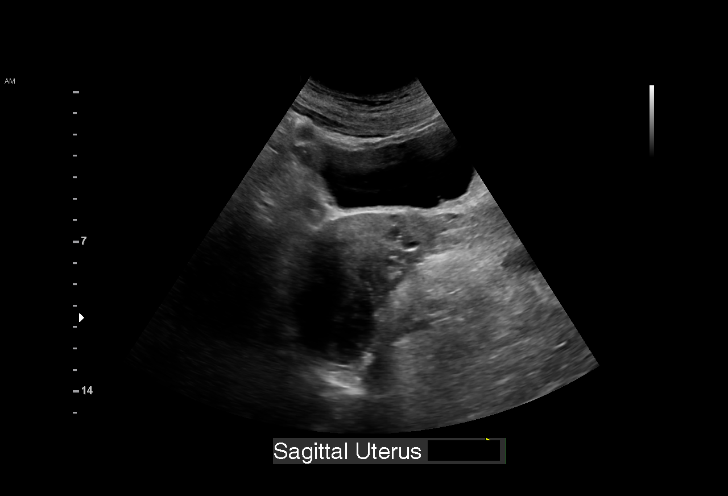
[im 9/101]
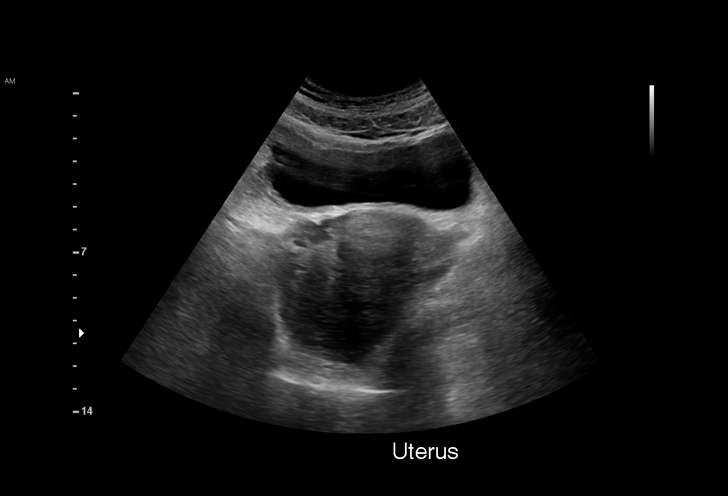
[im 17/101]
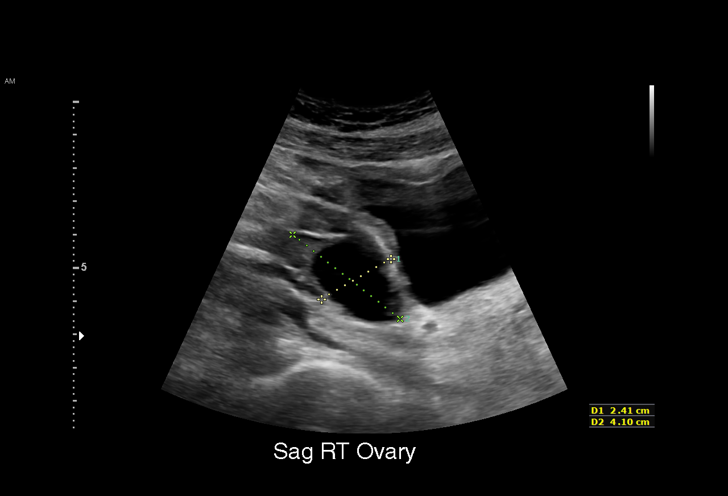
[im 21/101]
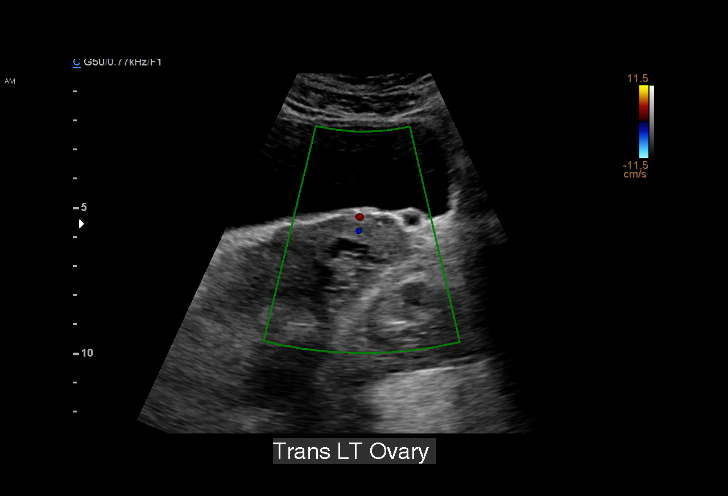
[im 30/101]
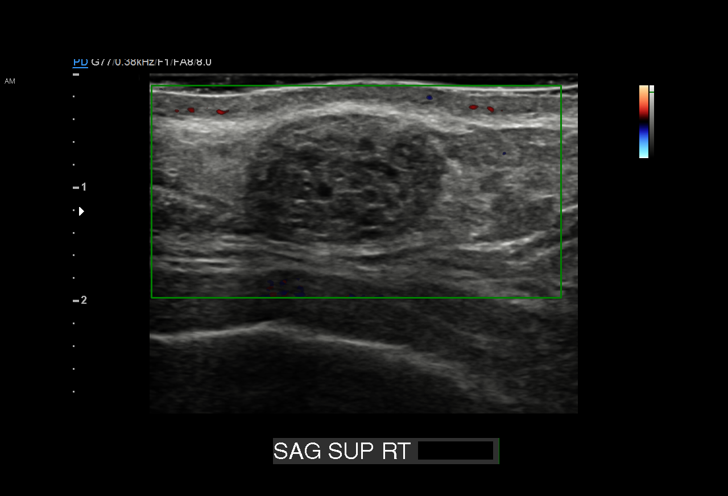
[im 38/101]
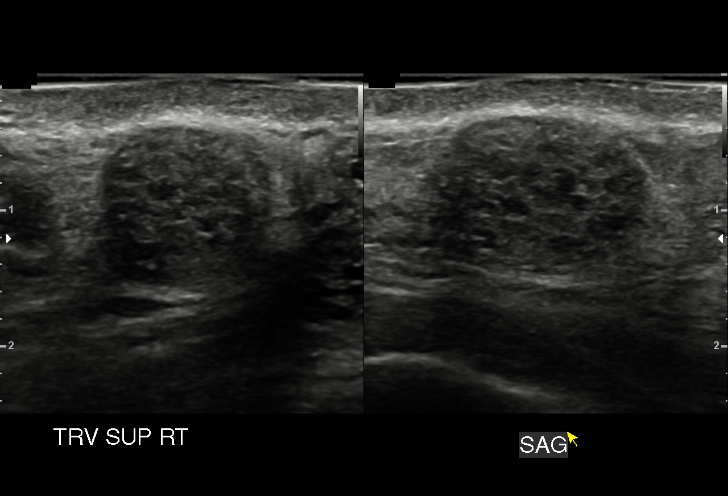
[im 42/101]
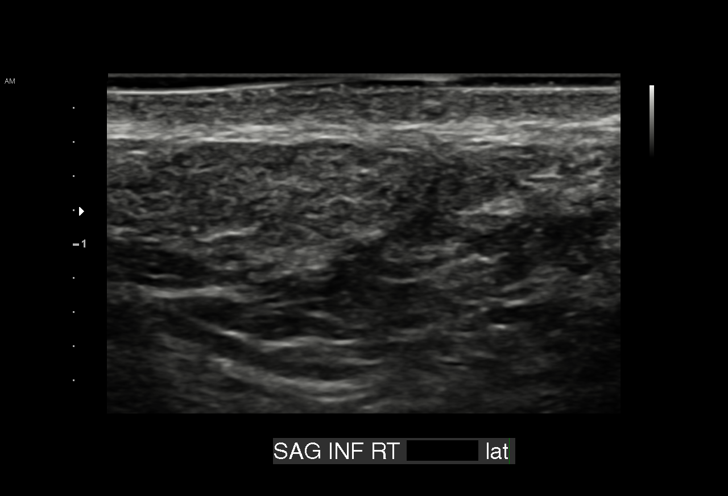
[im 51/101]
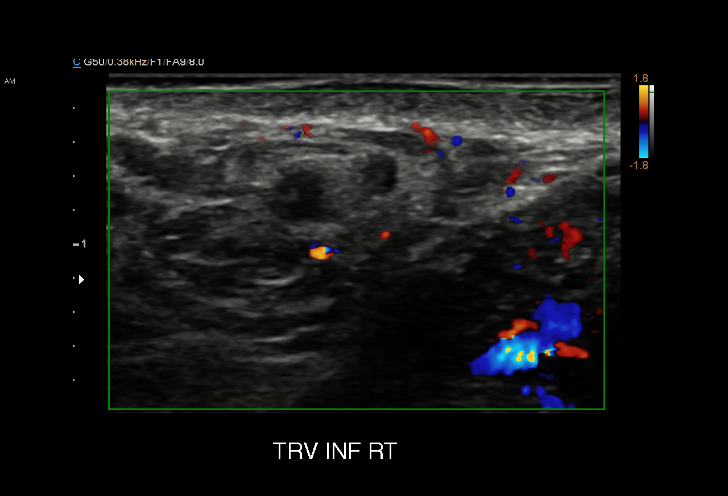
[im 59/101]
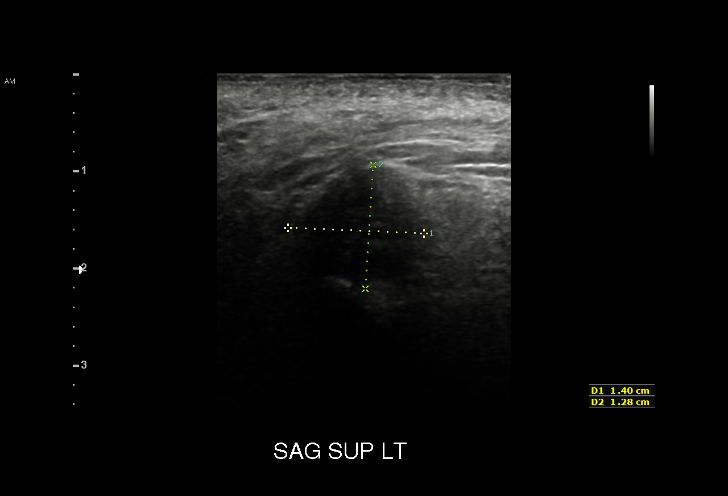
[im 63/101]
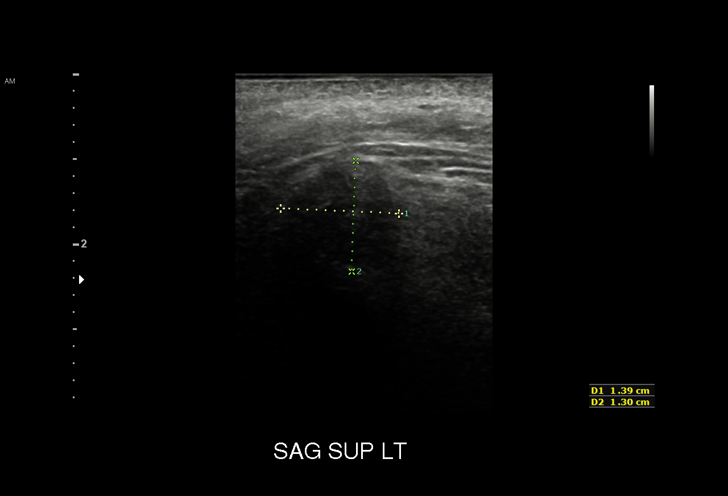
[im 71/101]
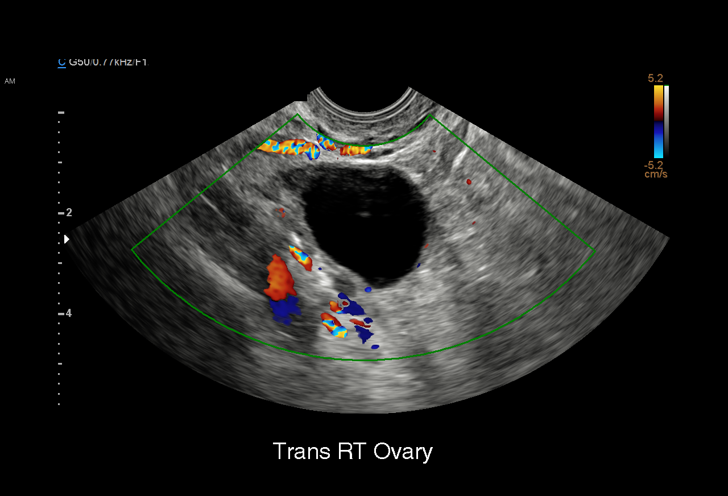
[im 80/101]
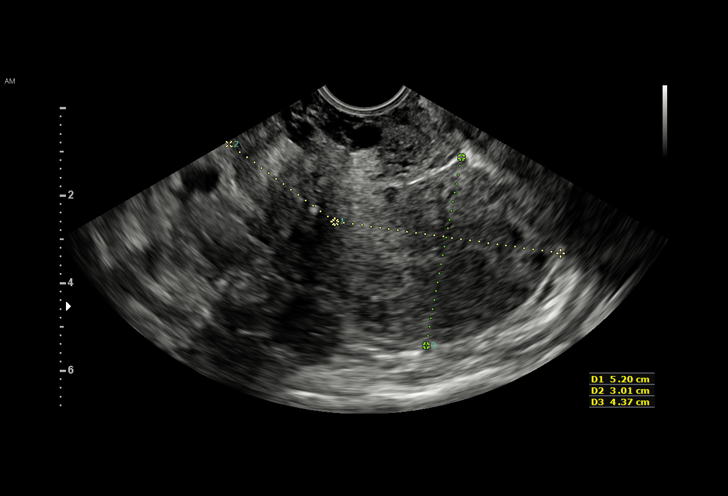
[im 84/101]
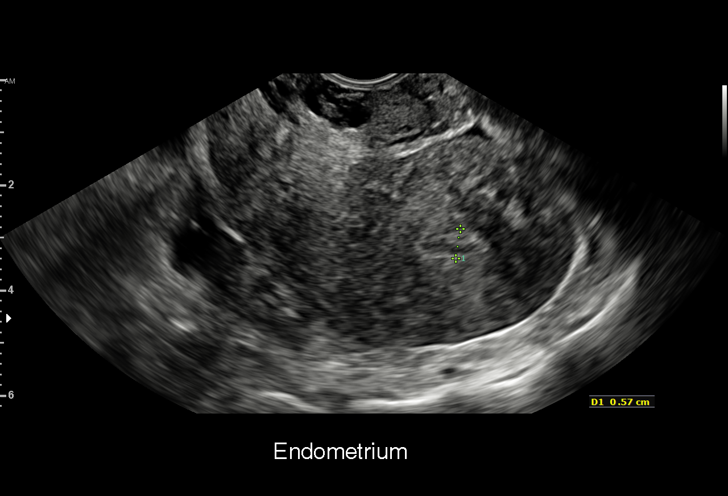
[im 92/101]
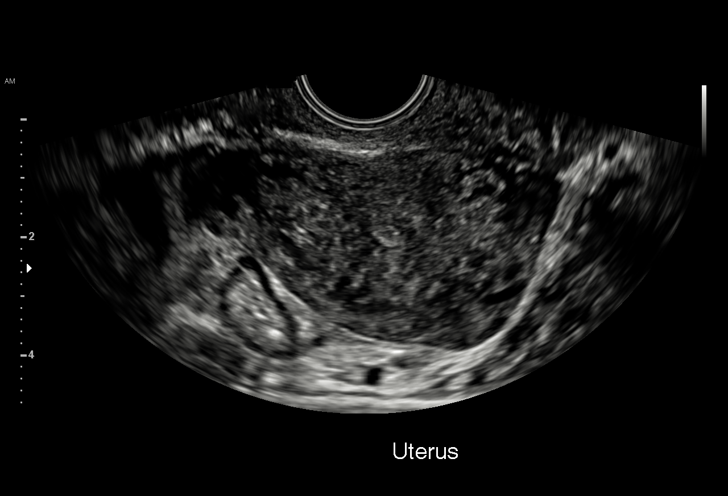
[im 101/101]
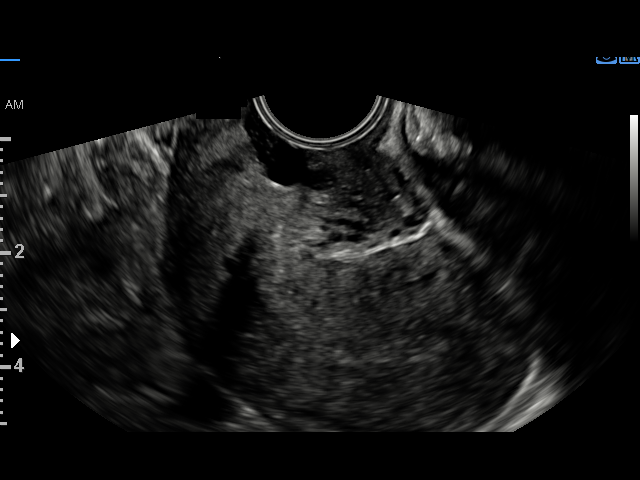

[15 of 25 positions shown; findings below may reference images not displayed]

FINDINGS: Uterus

Measurements: 8.2 x 4.4 x 5.4 cm. No fibroids or other mass
visualized.

Endometrium

Thickness: 6.6 mm.  No focal abnormality visualized.

Right ovary

Measurements: 5.1 x 2.9 x 3.1 cm. 3.8 x 2.3 x 2.5 cm simple cyst.

Left ovary

Measurements: 4.3 x 4.8 x 4.4 cm. Normal appearance/no adnexal mass.

Other findings

Trace free pelvic fluid.
IMPRESSION: 1.  3.8 cm simple cyst right ovary.  Trace free pelvic fluid .

2. Otherwise negative pelvic ultrasound. Reference is made to
ultrasound of the vulva report.
